# Patient Record
Sex: Male | Born: 1990 | Race: White | Hispanic: No | Marital: Single | Smoking: Never smoker
Health system: Southern US, Community
[De-identification: ages and names within clinical notes are randomized; demographics above are authoritative.]

## PROBLEM LIST (undated history)

## (undated) HISTORY — PX: APPENDECTOMY: SHX54

## (undated) HISTORY — PX: HAND SURGERY: SHX662

## (undated) HISTORY — PX: TIBIA FRACTURE SURGERY: SHX806

---
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
## 2010-07-02 ENCOUNTER — Inpatient Hospital Stay (HOSPITAL_COMMUNITY): Admission: EM | Admit: 2010-07-02 | Discharge: 2010-07-02 | Payer: Self-pay | Admitting: Emergency Medicine

## 2010-07-04 ENCOUNTER — Emergency Department (HOSPITAL_COMMUNITY): Admission: EM | Admit: 2010-07-04 | Discharge: 2010-07-04 | Payer: Self-pay | Admitting: Emergency Medicine

## 2011-01-13 LAB — CBC
MCHC: 35 g/dL (ref 30.0–36.0)
Platelets: 229 10*3/uL (ref 150–400)
RDW: 13.8 % (ref 11.5–15.5)
WBC: 10.8 10*3/uL — ABNORMAL HIGH (ref 4.0–10.5)

## 2011-01-13 LAB — BASIC METABOLIC PANEL
BUN: 9 mg/dL (ref 6–23)
Calcium: 9.1 mg/dL (ref 8.4–10.5)
Creatinine, Ser: 0.85 mg/dL (ref 0.4–1.5)
GFR calc non Af Amer: >60 mL/min (ref >60)
Glucose, Bld: 137 mg/dL — ABNORMAL HIGH (ref 70–99)

## 2011-03-21 IMAGING — CR DG KNEE COMPLETE 4+V*R*
4 series · 4 of 4 positions shown · non-contrast
Comparison: 07/02/2010

CLINICAL DATA: Pain and swelling.  Recent ORIF of the tibial
fracture.

RIGHT KNEE - COMPLETE 4+ VIEW

[t knee ap right *]
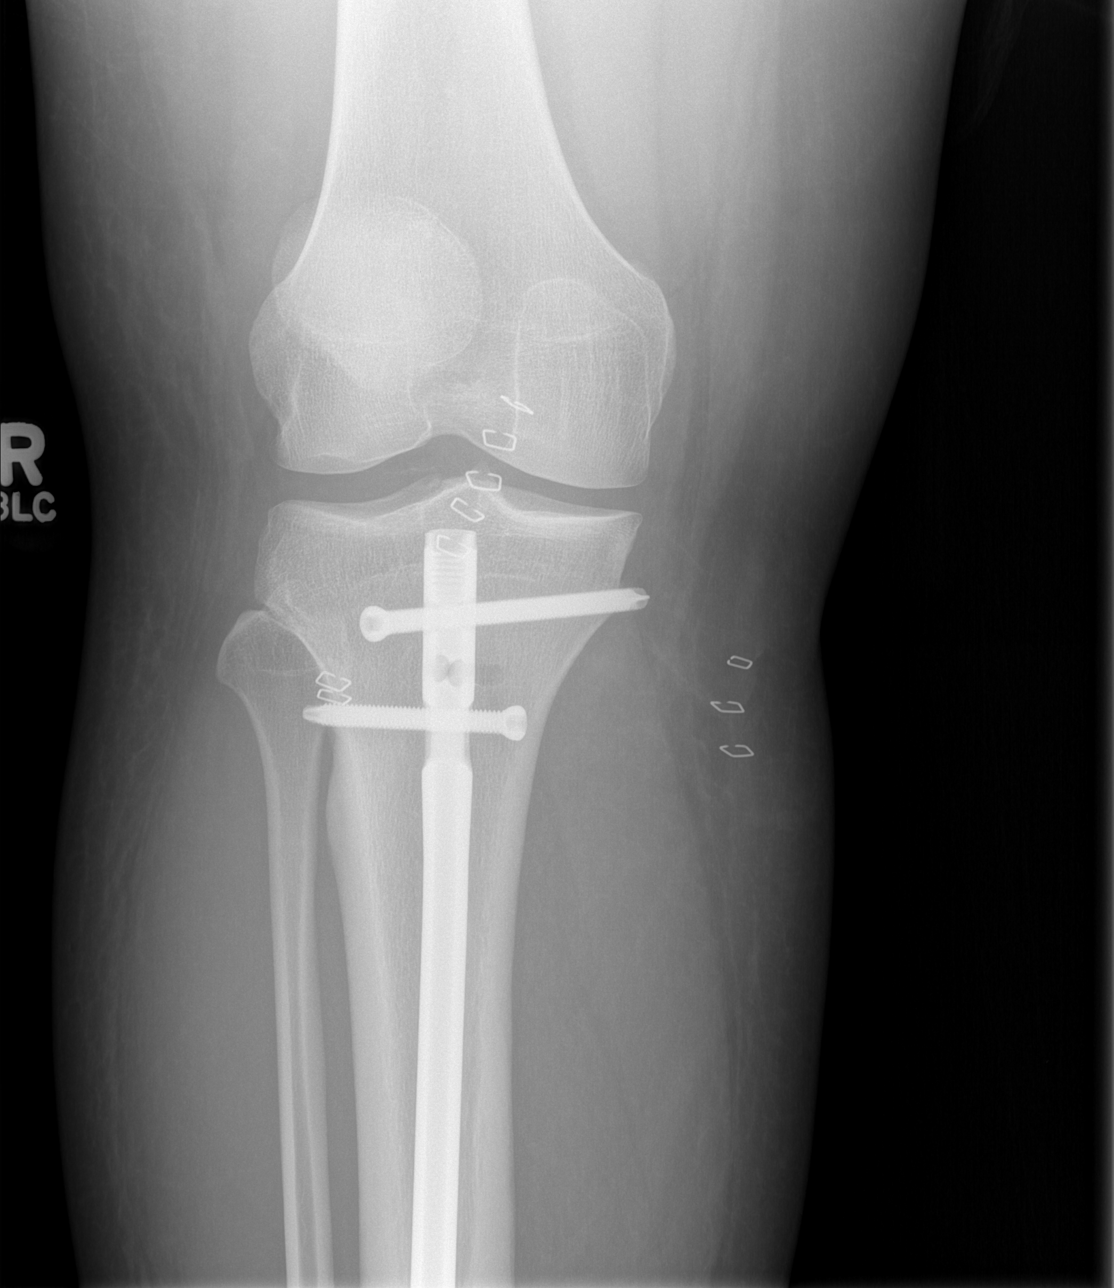

[t knee oblique right * (1 of 2)]
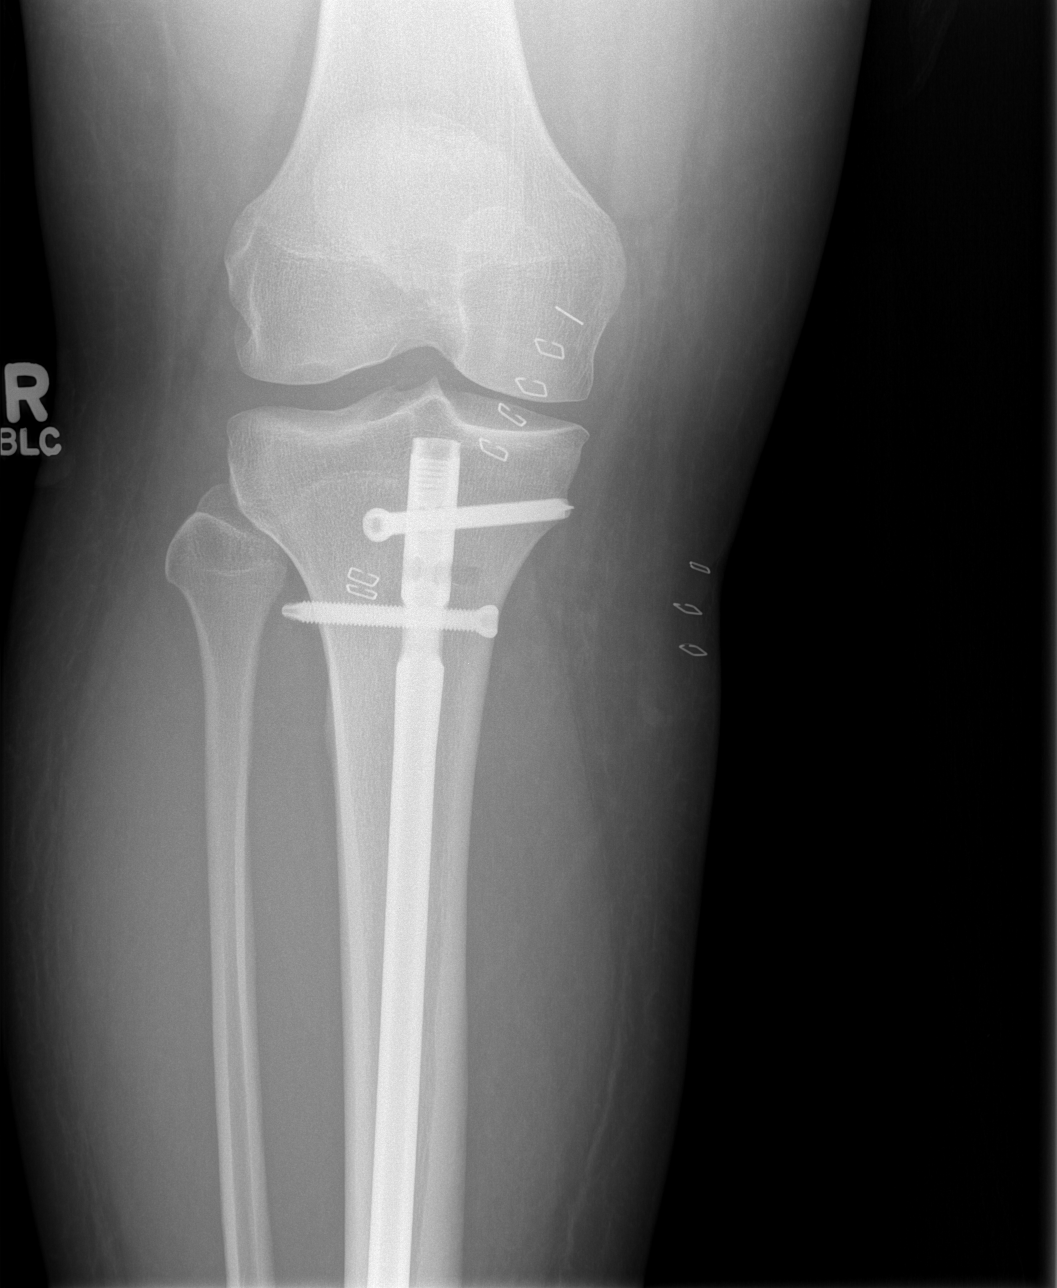

[t knee oblique right * (2 of 2)]
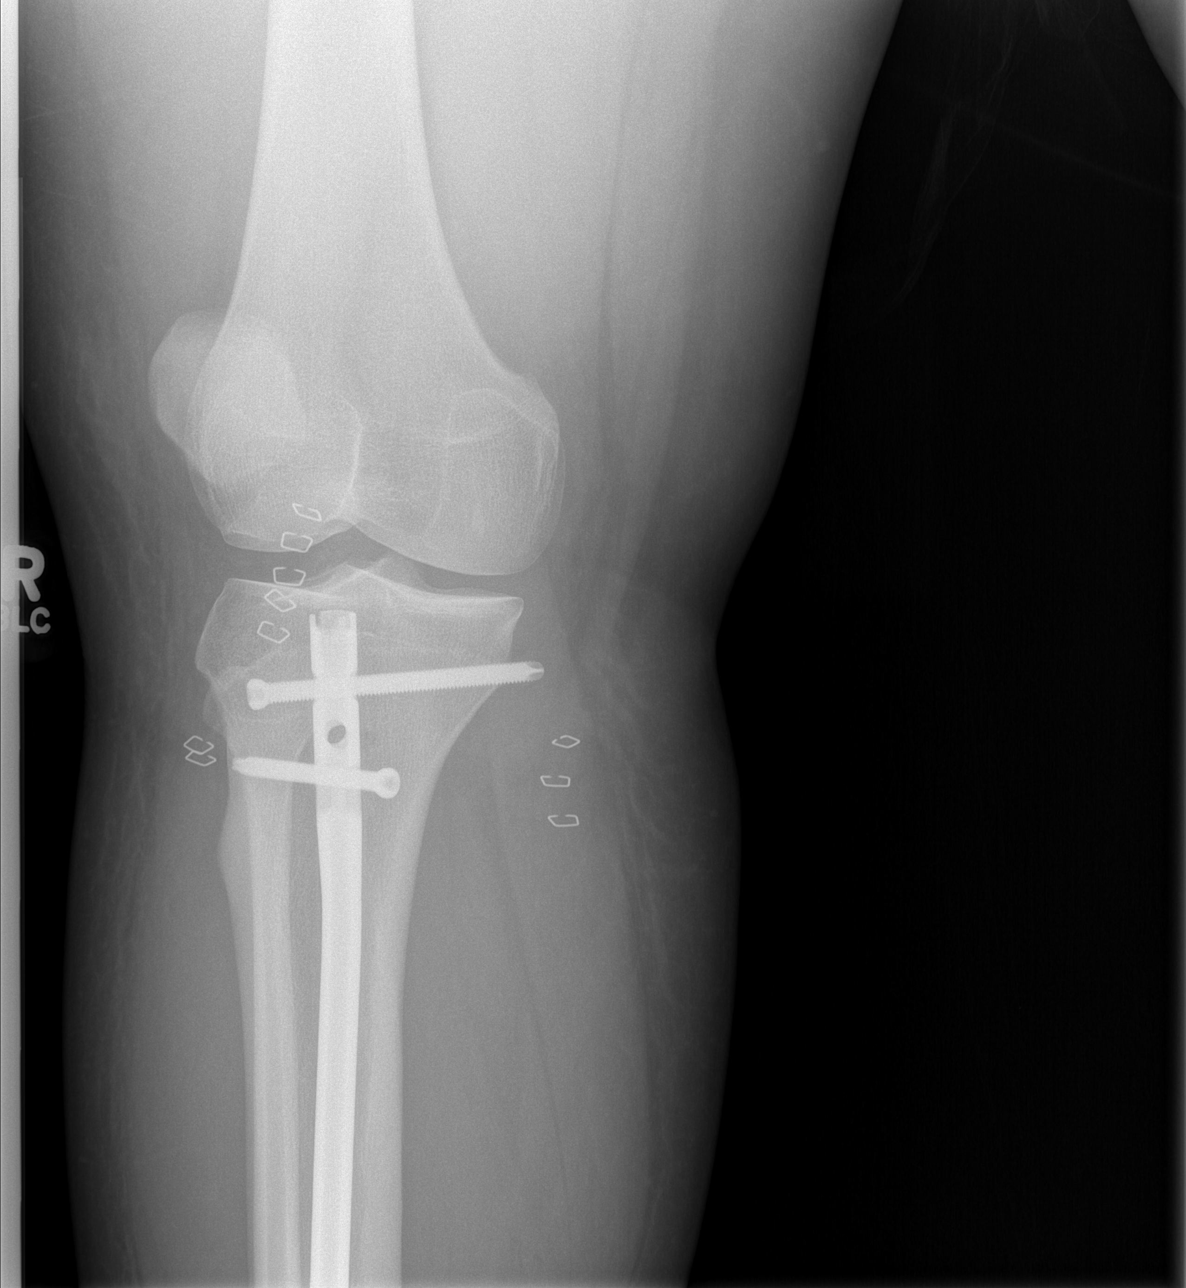

[view not recorded]
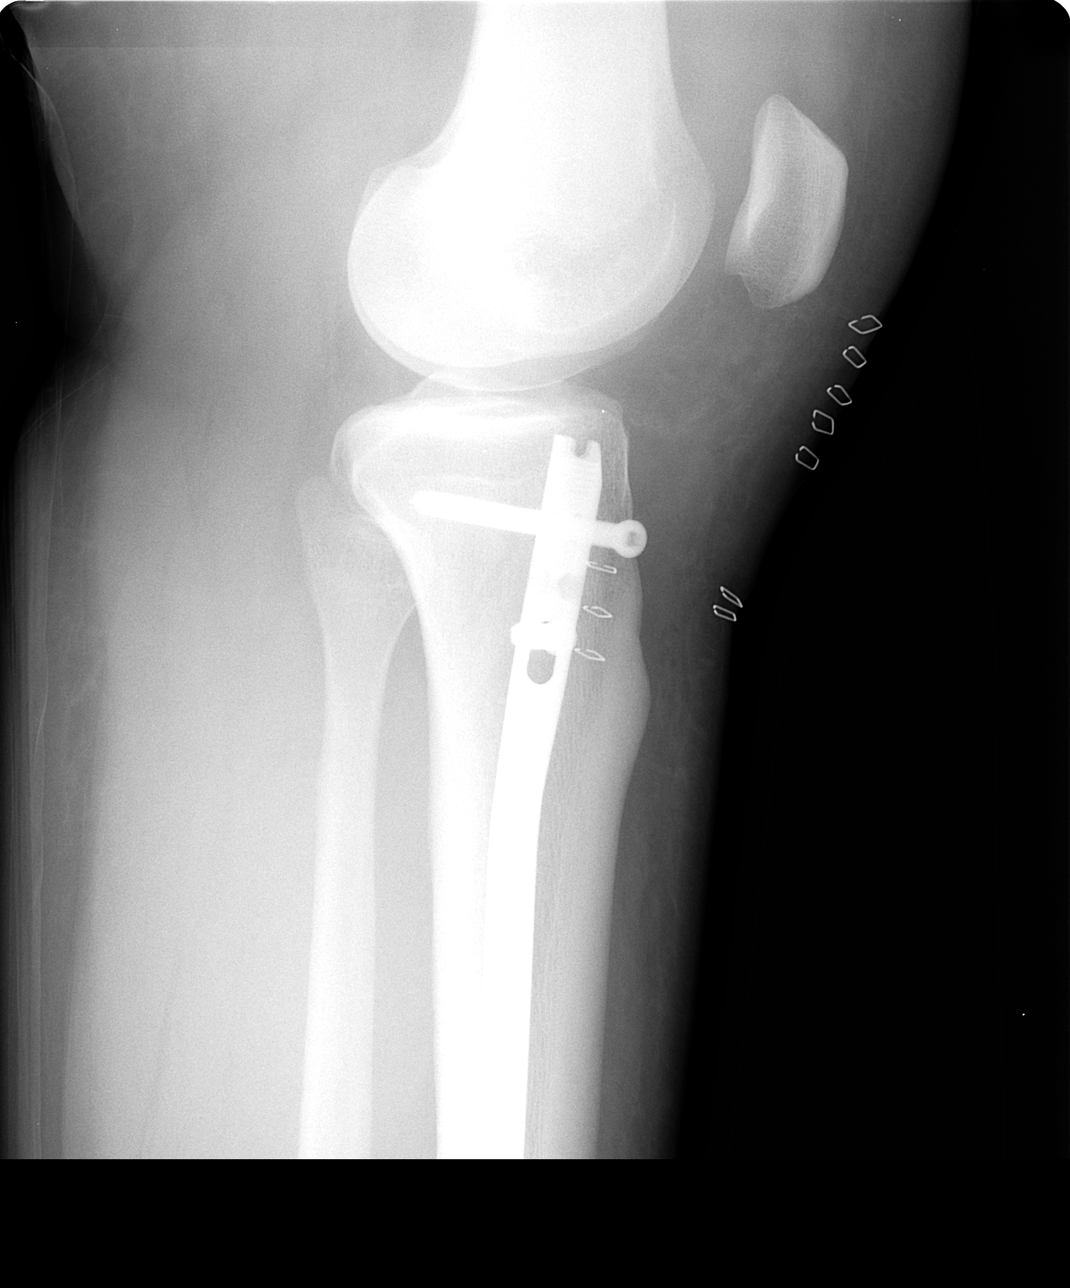

[4 of 4 positions shown; findings below may reference images not displayed]

FINDINGS: Mild diffuse soft tissue swelling and anterior surgical staples are
noted.

There is an IM nail and screw device within the tibia.

There is no evidence for hardware complication.

No fractures involving the left knee noted.
IMPRESSION: Stable appearance of the left knee status post ORIF left tibial
fracture.

## 2011-08-12 LAB — BASIC METABOLIC PANEL
Calcium: 9.8
Glucose, Bld: 122 — ABNORMAL HIGH
Potassium: 4.1
Sodium: 140

## 2011-08-12 LAB — CBC
Hemoglobin: 14.8
RBC: 5.09
RDW: 13
WBC: 10.6 — ABNORMAL HIGH

## 2013-03-15 ENCOUNTER — Emergency Department (HOSPITAL_COMMUNITY)
Admission: EM | Admit: 2013-03-15 | Discharge: 2013-03-15 | Disposition: A | Discharge status: Home / Self Care | Payer: Self-pay | Attending: Emergency Medicine | Admitting: Emergency Medicine

## 2013-03-15 ENCOUNTER — Encounter (HOSPITAL_COMMUNITY): Payer: Self-pay | Admitting: Emergency Medicine

## 2013-03-15 DIAGNOSIS — Y998 Other external cause status: Secondary | ICD-10-CM | POA: Insufficient documentation

## 2013-03-15 DIAGNOSIS — R21 Rash and other nonspecific skin eruption: Secondary | ICD-10-CM | POA: Insufficient documentation

## 2013-03-15 DIAGNOSIS — S40029A Contusion of unspecified upper arm, initial encounter: Secondary | ICD-10-CM | POA: Insufficient documentation

## 2013-03-15 DIAGNOSIS — S80811A Abrasion, right lower leg, initial encounter: Secondary | ICD-10-CM

## 2013-03-15 DIAGNOSIS — IMO0001 Reserved for inherently not codable concepts without codable children: Secondary | ICD-10-CM | POA: Insufficient documentation

## 2013-03-15 DIAGNOSIS — Y92410 Unspecified street and highway as the place of occurrence of the external cause: Secondary | ICD-10-CM | POA: Insufficient documentation

## 2013-03-15 DIAGNOSIS — S40021A Contusion of right upper arm, initial encounter: Secondary | ICD-10-CM

## 2013-03-15 DIAGNOSIS — IMO0002 Reserved for concepts with insufficient information to code with codable children: Secondary | ICD-10-CM | POA: Insufficient documentation

## 2013-03-15 MED ORDER — NAPROXEN 500 MG PO TABS: 500.0000 mg | ORAL_TABLET | Freq: Two times a day (BID) | ORAL | Status: DC
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
## 2013-03-16 NOTE — ED Provider Notes (Signed)
Medical screening examination/treatment/procedure(s) were performed by non-physician practitioner and as supervising physician I was immediately available for consultation/collaboration.   Voyd Emberlin Verner, MD 03/16/13 0137 

## 2014-03-30 ENCOUNTER — Encounter (HOSPITAL_COMMUNITY): Payer: Self-pay | Admitting: Emergency Medicine

## 2014-03-30 ENCOUNTER — Emergency Department (HOSPITAL_COMMUNITY)
Admission: EM | Admit: 2014-03-30 | Discharge: 2014-03-30 | Disposition: A | Discharge status: Home / Self Care | Payer: No Typology Code available for payment source | Attending: Emergency Medicine | Admitting: Emergency Medicine

## 2014-03-30 DIAGNOSIS — M549 Dorsalgia, unspecified: Secondary | ICD-10-CM

## 2014-03-30 DIAGNOSIS — IMO0002 Reserved for concepts with insufficient information to code with codable children: Secondary | ICD-10-CM | POA: Insufficient documentation

## 2014-03-30 DIAGNOSIS — Y9389 Activity, other specified: Secondary | ICD-10-CM | POA: Diagnosis not present

## 2014-03-30 DIAGNOSIS — Y92410 Unspecified street and highway as the place of occurrence of the external cause: Secondary | ICD-10-CM | POA: Diagnosis not present
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
## 2014-04-01 NOTE — ED Provider Notes (Signed)
  Medical screening examination/treatment/procedure(s) were performed by non-physician practitioner and as supervising physician I was immediately available for consultation/collaboration.   EKG Interpretation None         Danee Soller, MD 04/01/14 1549 

## 2019-11-12 ENCOUNTER — Ambulatory Visit: Payer: Self-pay | Attending: Internal Medicine

## 2019-11-12 DIAGNOSIS — Z20822 Contact with and (suspected) exposure to covid-19: Secondary | ICD-10-CM | POA: Insufficient documentation

## 2019-11-14 LAB — NOVEL CORONAVIRUS, NAA: SARS-CoV-2, NAA: NOT DETECTED
